# Patient Record
Sex: Female | Born: 1985 | Race: Asian | Hispanic: No | Marital: Married | Smoking: Current every day smoker
Health system: Southern US, Community
[De-identification: ages and names within clinical notes are randomized; demographics above are authoritative.]

## PROBLEM LIST (undated history)

## (undated) ENCOUNTER — Inpatient Hospital Stay (HOSPITAL_COMMUNITY): Payer: Self-pay

## (undated) DIAGNOSIS — D649 Anemia, unspecified: Secondary | ICD-10-CM

## (undated) DIAGNOSIS — O24419 Gestational diabetes mellitus in pregnancy, unspecified control: Secondary | ICD-10-CM

## (undated) HISTORY — DX: Gestational diabetes mellitus in pregnancy, unspecified control: O24.419

---

## 2010-11-05 ENCOUNTER — Emergency Department (HOSPITAL_COMMUNITY)
Admission: EM | Admit: 2010-11-05 | Discharge: 2010-11-05 | Disposition: A | Discharge status: Home / Self Care | Payer: Managed Care, Other (non HMO) | Attending: Emergency Medicine | Admitting: Emergency Medicine

## 2010-11-05 DIAGNOSIS — R109 Unspecified abdominal pain: Secondary | ICD-10-CM | POA: Insufficient documentation

## 2010-11-05 DIAGNOSIS — N390 Urinary tract infection, site not specified: Secondary | ICD-10-CM | POA: Insufficient documentation

## 2010-11-05 DIAGNOSIS — R319 Hematuria, unspecified: Secondary | ICD-10-CM | POA: Insufficient documentation

## 2010-11-05 LAB — WET PREP, GENITAL
Trich, Wet Prep: NONE SEEN
Yeast Wet Prep HPF POC: NONE SEEN

## 2010-11-05 LAB — URINE MICROSCOPIC-ADD ON

## 2010-11-05 LAB — URINALYSIS, ROUTINE W REFLEX MICROSCOPIC
Glucose, UA: NEGATIVE mg/dL
Specific Gravity, Urine: 1.022 (ref 1.005–1.030)

## 2010-11-06 LAB — GC/CHLAMYDIA PROBE AMP, GENITAL
Chlamydia, DNA Probe: NEGATIVE
GC Probe Amp, Genital: NEGATIVE

## 2010-11-07 LAB — URINE CULTURE
Colony Count: 80000
Culture  Setup Time: 201209040315

## 2011-02-20 ENCOUNTER — Inpatient Hospital Stay (HOSPITAL_COMMUNITY)
Admission: AD | Admit: 2011-02-20 | Discharge: 2011-02-20 | Disposition: A | Discharge status: Home / Self Care | Payer: Managed Care, Other (non HMO) | Source: Ambulatory Visit | Attending: Obstetrics and Gynecology | Admitting: Obstetrics and Gynecology

## 2011-02-20 ENCOUNTER — Encounter (HOSPITAL_COMMUNITY): Payer: Self-pay | Admitting: *Deleted

## 2011-02-20 DIAGNOSIS — B372 Candidiasis of skin and nail: Secondary | ICD-10-CM

## 2011-02-20 DIAGNOSIS — H9209 Otalgia, unspecified ear: Secondary | ICD-10-CM

## 2011-02-20 DIAGNOSIS — H9201 Otalgia, right ear: Secondary | ICD-10-CM

## 2011-02-20 MED ORDER — NYSTATIN-TRIAMCINOLONE 100000-0.1 UNIT/GM-% EX OINT: TOPICAL_OINTMENT | Freq: Two times a day (BID) | CUTANEOUS | Status: DC

## 2011-02-20 NOTE — ED Provider Notes (Signed)
History     Chief Complaint  Patient presents with  . Otalgia   HPI  Deanna Kaufman 25 y.o. presents with complaints of a right sore ear and a "cold" rash on her breasts. She has recently had a cold.  She describes the rash as beginning small and now larger.  There is itching associated with the rash.  She also states she had a ring put in to prevent pregnancy, in [place] 3-4 years ago and wanted to know if it could be removed and replaced tonight.     No past medical history on file.  No past surgical history on file.  No family history on file.  History  Substance Use Topics  . Smoking status: Never Smoker   . Smokeless tobacco: Never Used  . Alcohol Use: No    Allergies: Allergies not on file  No prescriptions prior to admission    Review of Systems  Constitutional: Negative for fever and chills.  HENT: Positive for ear pain (right ear).   Gastrointestinal: Negative for nausea, vomiting, abdominal pain and diarrhea.  Genitourinary: Negative.   Skin:       Rash on both breasts.   Physical Exam   Blood pressure 99/69, pulse 76, temperature 98.7 F (37.1 C), temperature source Oral, resp. rate 20, height 5\' 1"  (1.549 m), weight 122 lb (55.339 kg), last menstrual period 02/14/2011, SpO2 99.00%.  Physical Exam  Constitutional: She is oriented to person, place, and time. She appears well-developed and well-nourished. No distress.  HENT:  Right Ear: External ear normal.  Left Ear: External ear normal.  Neck: Normal range of motion.  Respiratory: Effort normal.  Neurological: She is alert and oriented to person, place, and time.  Skin: Skin is warm and dry.       There is a small brownish patch/area on the right breast b/t 12:00 and 1:00, slightly dry without oozing or redness.  The left breast has a similar area b/t 1:00-2:00 and dry.      MAU Course  Procedures  MDM   Assessment and Plan  A:  Right ear pain       Possible candidal infection of the  skin  P:  Rx for Mycolog to use bid to affected area X 1 week       May use over the counter cold products for URI sxs       Health Dept # given for her to call for contraception.  Deanna Kaufman,EVE M 02/20/2011, 8:43 PM   Matt Holmes, NP 02/20/11 2054

## 2011-02-20 NOTE — Progress Notes (Signed)
Pt states she has a rt ear ache and a spot on each breast she wants to have examined

## 2011-02-21 NOTE — ED Provider Notes (Signed)
Attestation of Attending Supervision of Advanced Practitioner: Evaluation and management procedures were performed by the PA/NP/CNM/OB Fellow under my supervision/collaboration. Chart reviewed and agree with management and plan.  Ramiel Forti V 02/21/2011 7:51 AM

## 2012-01-27 ENCOUNTER — Emergency Department (HOSPITAL_COMMUNITY)
Admission: EM | Admit: 2012-01-27 | Discharge: 2012-01-27 | Disposition: A | Discharge status: Home / Self Care | Payer: Managed Care, Other (non HMO) | Attending: Emergency Medicine | Admitting: Emergency Medicine

## 2012-01-27 ENCOUNTER — Encounter (HOSPITAL_COMMUNITY): Payer: Self-pay | Admitting: *Deleted

## 2012-01-27 DIAGNOSIS — H9190 Unspecified hearing loss, unspecified ear: Secondary | ICD-10-CM | POA: Insufficient documentation

## 2012-01-27 DIAGNOSIS — H538 Other visual disturbances: Secondary | ICD-10-CM | POA: Insufficient documentation

## 2012-01-27 DIAGNOSIS — H6690 Otitis media, unspecified, unspecified ear: Secondary | ICD-10-CM | POA: Insufficient documentation

## 2012-01-27 DIAGNOSIS — R51 Headache: Secondary | ICD-10-CM | POA: Insufficient documentation

## 2012-01-27 MED ORDER — AMOXICILLIN 500 MG PO CAPS: 1000.0000 mg | ORAL_CAPSULE | Freq: Two times a day (BID) | ORAL | Status: DC

## 2012-01-27 MED ORDER — TRAMADOL HCL 50 MG PO TABS: 50.0000 mg | ORAL_TABLET | Freq: Four times a day (QID) | ORAL | Status: DC | PRN

## 2012-01-27 NOTE — ED Notes (Signed)
Pt c/o right ear pain and right head pain x 2 days.  Denies n/v, fevers/chills.

## 2012-01-27 NOTE — ED Provider Notes (Signed)
History   This chart was scribed for [employer]. Rubin Payor, MD by Sofie Rower, ED Scribe. The patient was seen in room TR06C/TR06C and the patient's care was started at 8:56PM.    CSN: [id number]  Arrival date & time 01/27/12  2016   First MD Initiated Contact with Patient 01/27/12 2056      Chief Complaint  Patient presents with  . Otalgia    (Consider location/radiation/quality/duration/timing/severity/associated sxs/prior treatment) The history is provided by the patient. No language interpreter was used.    Deanna Kaufman is a 26 y.o. female , who presents to the Emergency Department complaining of  sudden, progressively worsening, otalgia, located at the right ear, onset two days ago (01/25/12).  Associated symptoms include headache located at the right side of the head, hearing loss, and blurred vision. The pt reports she has been experiencing a constant earache, accompanied by hearing loss, for the past two days. The pt informs both of her ears are painful, however, her right ear is in significantly more pain than the left.   The pt denies allergies to any medications. In addition, the pt denies any possibility of pregnancy.   The pt does not smoke or drink alcohol.      History reviewed. No pertinent past medical history.  History reviewed. No pertinent past surgical history.  History reviewed. No pertinent family history.  History  Substance Use Topics  . Smoking status: Never Smoker   . Smokeless tobacco: Never Used  . Alcohol Use: No    OB History    Grav Para Term Preterm Abortions TAB SAB Ect Mult Living   1 1        1       Review of Systems  Constitutional: Negative for fever and chills.  HENT: Positive for ear pain. Negative for rhinorrhea.   Eyes: Positive for visual disturbance. Negative for pain.  Respiratory: Negative for cough.   Gastrointestinal: Negative for nausea and vomiting.  Neurological: Positive for headaches.    Allergies  Review  of patient's allergies indicates no known allergies.  Home Medications   Current Outpatient Rx  Name  Route  Sig  Dispense  Refill  . IBUPROFEN 200 MG PO TABS   Oral   Take 400 mg by mouth every 8 (eight) hours as needed. For pain or fever         . AMOXICILLIN 500 MG PO CAPS   Oral   Take 2 capsules (1,000 mg total) by mouth 2 (two) times daily.   28 capsule   0   . TRAMADOL HCL 50 MG PO TABS   Oral   Take 1 tablet (50 mg total) by mouth every 6 (six) hours as needed for pain.   15 tablet   0     BP 110/75  Pulse 69  Temp 98.4 F (36.9 C) (Oral)  Resp 20  SpO2 100%  Physical Exam  Nursing note and vitals reviewed. Constitutional: She is oriented to person, place, and time. She appears well-developed and well-nourished.  HENT:  Head: Atraumatic.  Right Ear: There is mastoid tenderness. Tympanic membrane is erythematous and bulging.  Left Ear: Tympanic membrane normal. Tympanic membrane is not erythematous and not bulging.  Nose: Nose normal.  Mouth/Throat: Oropharynx is clear and moist.  Eyes: Conjunctivae normal and EOM are normal. Pupils are equal, round, and reactive to light.  Neck: Normal range of motion. Neck supple.  Cardiovascular: Normal rate, regular rhythm and normal heart sounds.   Pulmonary/Chest:  Effort normal and breath sounds normal. No respiratory distress.  Lymphadenopathy:    She has no cervical adenopathy.  Neurological: She is alert and oriented to person, place, and time.  Skin: Skin is warm and dry.  Psychiatric: She has a normal mood and affect. Her behavior is normal.    ED Course  Procedures (including critical care time)  9:04 PM- Treatment plan concerning management of ear infection and application of antibiotics and pain management discussed with patient. Pt agrees with treatment.     Labs Reviewed - No data to display No results found.   1. Otitis media       MDM  Patient with ear pain. Apparent otitis media. Some  tenderness over right mastiod, but no swelling or erethema. Was given instruction about what to watch for. Normal neuro exam for me. Well appearing.       I personally performed the services described in this documentation, which was scribed in my presence. The recorded information has been reviewed and is accurate.     Juliet Rude. Rubin Payor, MD 01/27/12 2130

## 2012-10-29 ENCOUNTER — Encounter (HOSPITAL_COMMUNITY): Payer: Self-pay

## 2012-10-29 ENCOUNTER — Emergency Department (HOSPITAL_COMMUNITY)
Admission: EM | Admit: 2012-10-29 | Discharge: 2012-10-29 | Disposition: A | Discharge status: Home / Self Care | Payer: Managed Care, Other (non HMO) | Attending: Emergency Medicine | Admitting: Emergency Medicine

## 2012-10-29 DIAGNOSIS — L02211 Cutaneous abscess of abdominal wall: Secondary | ICD-10-CM

## 2012-10-29 DIAGNOSIS — Z79899 Other long term (current) drug therapy: Secondary | ICD-10-CM | POA: Insufficient documentation

## 2012-10-29 DIAGNOSIS — L02219 Cutaneous abscess of trunk, unspecified: Secondary | ICD-10-CM | POA: Insufficient documentation

## 2012-10-29 DIAGNOSIS — L089 Local infection of the skin and subcutaneous tissue, unspecified: Secondary | ICD-10-CM | POA: Insufficient documentation

## 2012-10-29 MED ORDER — IBUPROFEN 800 MG PO TABS
800.0000 mg | ORAL_TABLET | Freq: Once | ORAL | Status: AC
  Administered 2012-10-29: 800 mg via ORAL
  Filled 2012-10-29: qty 1

## 2012-10-29 MED ORDER — SULFAMETHOXAZOLE-TRIMETHOPRIM 800-160 MG PO TABS: 1.0000 | ORAL_TABLET | Freq: Two times a day (BID) | ORAL | Status: DC

## 2012-10-29 NOTE — ED Notes (Signed)
Pt complains of an abcess on her left lower abdomen and one on her vagina for three days

## 2012-10-30 NOTE — ED Provider Notes (Signed)
CSN: [id number]     Arrival date & time 10/29/12  1922 History   First MD Initiated Contact with Patient 10/29/12 2309     Chief Complaint  Patient presents with  . Bartholin's Cyst  . Recurrent Skin Infections   (Consider location/radiation/quality/duration/timing/severity/associated sxs/prior Treatment) The history is provided by the patient.   patient reports abscess to her left lower anterior abdominal wall as well as the left side of her vagina.  She reports no significant drainage.  She does shave her pubic hair with a razor.  No history of prior abscesses.  No fevers or chills.  No surrounding erythema.  No vaginal complaints.  Symptoms are mild to moderate in severity.  Pain is worsened by palpation.  History reviewed. No pertinent past medical history. History reviewed. No pertinent past surgical history. History reviewed. No pertinent family history. History  Substance Use Topics  . Smoking status: Never Smoker   . Smokeless tobacco: Never Used  . Alcohol Use: No   OB History   Grav Para Term Preterm Abortions TAB SAB Ect Mult Living   1 1        1      Review of Systems  All other systems reviewed and are negative.    Allergies  Review of patient's allergies indicates no known allergies.  Home Medications   Current Outpatient Rx  Name  Route  Sig  Dispense  Refill  . Multiple Vitamin (MULTIVITAMIN WITH MINERALS) TABS tablet   Oral   Take 1 tablet by mouth daily.         . vitamin C (ASCORBIC ACID) 500 MG tablet   Oral   Take 500 mg by mouth daily.         Marland Kitchen sulfamethoxazole-trimethoprim (SEPTRA DS) 800-160 MG per tablet   Oral   Take 1 tablet by mouth every 12 (twelve) hours.   10 tablet   0    BP 107/64  Pulse 67  Temp(Src) 98.2 F (36.8 C) (Oral)  Resp 20  SpO2 100%  LMP 10/08/2012 Physical Exam  Nursing note and vitals reviewed. Constitutional: She is oriented to person, place, and time. She appears well-developed and well-nourished. No  distress.  HENT:  Head: Normocephalic and atraumatic.  Eyes: EOM are normal.  Neck: Normal range of motion.  Cardiovascular: Normal rate, regular rhythm and normal heart sounds.   Pulmonary/Chest: Effort normal and breath sounds normal.  Abdominal: Soft. She exhibits no distension.  Small focal abscess of her very low left lower quadrant of her anterior abdomen.  No surrounding erythema.  Genitourinary:  Chaperone present during GU examination.  GU exam demonstrates a very small area of folliculitis of her left lower labia majora.  There is no fluctuance or surrounding erythema.  The areas is too punctate to incise and drain.  Musculoskeletal: Normal range of motion.  Neurological: She is alert and oriented to person, place, and time.  Skin: Skin is warm and dry.  Psychiatric: She has a normal mood and affect. Judgment normal.    ED Course  Procedures (including critical care time)  INCISION AND DRAINAGE Performed by: Lyanne Co Consent: Verbal consent obtained. Risks and benefits: risks, benefits and alternatives were discussed Time out performed prior to procedure Type: abscess Body area: left lower abdominal wall Anesthesia: local infiltration Incision was made with a scalpel. Local anesthetic: lidocaine 2 % with epinephrine Anesthetic total: 3 ml Complexity: complex Blunt dissection to break up loculations Drainage: purulent Drainage amount: Small  Packing material:  None  Patient tolerance: Patient tolerated the procedure well with no immediate complications.     Labs Review Labs Reviewed - No data to display Imaging Review No results found.  MDM   1. Abdominal wall abscess    Incision and drainage of the abdominal wall abscess.  Will be placed on antibiotics.  The area of her left labia is too small to incise at this time.  Recommend warm compresses and she will be on antibiotics.  Discharge home in good condition.  She understands to return to the ER for  new or worsening symptoms    Lyanne Co, MD 10/30/12 [phone]

## 2012-12-20 ENCOUNTER — Encounter (HOSPITAL_COMMUNITY): Payer: Self-pay | Admitting: *Deleted

## 2012-12-20 ENCOUNTER — Inpatient Hospital Stay (HOSPITAL_COMMUNITY)
Admission: AD | Admit: 2012-12-20 | Discharge: 2012-12-20 | Disposition: A | Discharge status: Home / Self Care | Payer: Managed Care, Other (non HMO) | Source: Ambulatory Visit | Attending: Family Medicine | Admitting: Family Medicine

## 2012-12-20 ENCOUNTER — Inpatient Hospital Stay (HOSPITAL_COMMUNITY): Payer: Managed Care, Other (non HMO)

## 2012-12-20 DIAGNOSIS — O209 Hemorrhage in early pregnancy, unspecified: Secondary | ICD-10-CM

## 2012-12-20 DIAGNOSIS — Z1389 Encounter for screening for other disorder: Secondary | ICD-10-CM

## 2012-12-20 DIAGNOSIS — Z3490 Encounter for supervision of normal pregnancy, unspecified, unspecified trimester: Secondary | ICD-10-CM

## 2012-12-20 LAB — URINALYSIS, ROUTINE W REFLEX MICROSCOPIC
Bilirubin Urine: NEGATIVE
Ketones, ur: NEGATIVE mg/dL
Nitrite: NEGATIVE
Protein, ur: NEGATIVE mg/dL
Urobilinogen, UA: 0.2 mg/dL (ref 0.0–1.0)
pH: 8 (ref 5.0–8.0)

## 2012-12-20 LAB — CBC
MCH: 21.8 pg — ABNORMAL LOW (ref 26.0–34.0)
Platelets: 248 10*3/uL (ref 150–400)
RBC: 5.13 MIL/uL — ABNORMAL HIGH (ref 3.87–5.11)

## 2012-12-20 LAB — ABO/RH: ABO/RH(D): O POS

## 2012-12-20 LAB — HCG, QUANTITATIVE, PREGNANCY: hCG, Beta Chain, Quant, S: 64150 m[IU]/mL — ABNORMAL HIGH (ref <5)

## 2012-12-20 LAB — WET PREP, GENITAL: Clue Cells Wet Prep HPF POC: NONE SEEN

## 2012-12-20 NOTE — MAU Provider Note (Signed)
Chart reviewed and agree with management and plan.  

## 2012-12-20 NOTE — MAU Note (Signed)
Patient presents with vaginal bleeding when wipes. States her last menstrual period was around September 11 sometime. Took a HPT but there was no control line.

## 2012-12-20 NOTE — MAU Provider Note (Signed)
History     CSN: [id number]  Arrival date and time: 12/20/12 0229   None     Chief Complaint  Patient presents with  . Vaginal Bleeding   HPI This is a 27 y.o. female at [redacted]w[redacted]d who presents with c/o bleeding.  States she thinks she is pregnant. Per RN report, pt does not want to be pregnant because her husband has no job "and does not help me at all".  RN overheard them arguing several times.  Patient states she may want an abortion.  Husband states "she is crazy" several times.  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 She denies any verbal, emotional, or physical abuse (RN had asked husband to leave room and questioned her)   RN Note: Patient presents with vaginal bleeding when wipes. States her last menstrual period was around September 11 sometime. Took a HPT but there was no control line.        OB History   Grav Para Term Preterm Abortions TAB SAB Ect Mult Living   2 1 1       1       History reviewed. No pertinent past medical history.  History reviewed. No pertinent past surgical history.  History reviewed. No pertinent family history.  History  Substance Use Topics  . Smoking status: Never Smoker   . Smokeless tobacco: Never Used  . Alcohol Use: No    Allergies: No Known Allergies  Prescriptions prior to admission  Medication Sig Dispense Refill  . Multiple Vitamin (MULTIVITAMIN WITH MINERALS) TABS tablet Take 1 tablet by mouth daily.      Marland Kitchen sulfamethoxazole-trimethoprim (SEPTRA DS) 800-160 MG per tablet Take 1 tablet by mouth every 12 (twelve) hours.  10 tablet  0  . vitamin C (ASCORBIC ACID) 500 MG tablet Take 500 mg by mouth daily.        Review of Systems  Constitutional: Negative for fever and malaise/fatigue.  Gastrointestinal: Negative for nausea, vomiting, abdominal pain, diarrhea and constipation.  Genitourinary:       Spotting    Physical Exam   Blood pressure 116/53, pulse 76, temperature 98 F (36.7 C), temperature  source Oral, resp. rate 18, height 4\' 11"  (1.499 m), weight 59.875 kg (132 lb), last menstrual period 11/12/2012.  Physical Exam  Constitutional: She is oriented to person, place, and time. She appears well-developed and well-nourished. No distress.  Cardiovascular: Normal rate.   Respiratory: Effort normal.  GI: Soft. She exhibits no distension. There is tenderness (over uterus with bimanual exam). There is no rebound and no guarding.  Genitourinary: Vagina normal and uterus normal. No vaginal discharge (no blood seen) found.  Uterus small, tender Adnexae nontender Cervix long and closed   Musculoskeletal: Normal range of motion.  Neurological: She is alert and oriented to person, place, and time.  Skin: Skin is warm and dry.  Psychiatric: She has a normal mood and affect.    MAU Course  Procedures  MDM Results for orders placed during the hospital encounter of 12/20/12 (from the past 24 hour(s))  URINALYSIS, ROUTINE W REFLEX MICROSCOPIC     Status: None   Collection Time    12/20/12  2:43 AM      Result Value Range   Color, Urine YELLOW  YELLOW   APPearance CLEAR  CLEAR   Specific Gravity, Urine 1.020  1.005 - 1.030   pH 8.0  5.0 - 8.0   Glucose, UA NEGATIVE  NEGATIVE mg/dL   Hgb urine  dipstick NEGATIVE  NEGATIVE   Bilirubin Urine NEGATIVE  NEGATIVE   Ketones, ur NEGATIVE  NEGATIVE mg/dL   Protein, ur NEGATIVE  NEGATIVE mg/dL   Urobilinogen, UA 0.2  0.0 - 1.0 mg/dL   Nitrite NEGATIVE  NEGATIVE   Leukocytes, UA NEGATIVE  NEGATIVE  POCT PREGNANCY, URINE     Status: Abnormal   Collection Time    12/20/12  2:50 AM      Result Value Range   Preg Test, Ur POSITIVE (*) NEGATIVE  HCG, QUANTITATIVE, PREGNANCY     Status: Abnormal   Collection Time    12/20/12  3:27 AM      Result Value Range   hCG, Beta Chain, Quant, S 64150 (*) <5 mIU/mL  CBC     Status: Abnormal   Collection Time    12/20/12  3:27 AM      Result Value Range   WBC 8.8  4.0 - 10.5 K/uL   RBC 5.13 (*)  3.87 - 5.11 MIL/uL   Hemoglobin 11.2 (*) 12.0 - 15.0 g/dL   HCT 95.6 (*) 21.3 - 08.6 %   MCV 67.8 (*) 78.0 - 100.0 fL   MCH 21.8 (*) 26.0 - 34.0 pg   MCHC 32.2  30.0 - 36.0 g/dL   RDW 57.8  46.9 - 62.9 %   Platelets 248  150 - 400 K/uL  ABO/RH     Status: None   Collection Time    12/20/12  3:27 AM      Result Value Range   ABO/RH(D) O POS    WET PREP, GENITAL     Status: Abnormal   Collection Time    12/20/12  3:29 AM      Result Value Range   Yeast Wet Prep HPF POC NONE SEEN  NONE SEEN   Trich, Wet Prep NONE SEEN  NONE SEEN   Clue Cells Wet Prep HPF POC NONE SEEN  NONE SEEN   WBC, Wet Prep HPF POC FEW (*) NONE SEEN   US showed 7.3wks CRL with [facility] 08/05/13 Small subchorionic hemorrhage   Assessment and Plan  A:  SIUP at 7.3weeks      [facility]  P:  DIscharge home      Pelvic rest      List of providers given.        [facility] 12/20/2012, 3:07 AM

## 2012-12-21 LAB — GC/CHLAMYDIA PROBE AMP: GC Probe RNA: NEGATIVE

## 2013-01-11 LAB — OB RESULTS CONSOLE HEPATITIS B SURFACE ANTIGEN: Hepatitis B Surface Ag: NEGATIVE

## 2013-01-11 LAB — OB RESULTS CONSOLE HIV ANTIBODY (ROUTINE TESTING): HIV: NONREACTIVE

## 2013-01-11 LAB — OB RESULTS CONSOLE RUBELLA ANTIBODY, IGM: RUBELLA: UNDETERMINED

## 2013-01-20 LAB — OB RESULTS CONSOLE GC/CHLAMYDIA
Chlamydia: NEGATIVE
Gonorrhea: NEGATIVE

## 2013-01-24 ENCOUNTER — Inpatient Hospital Stay (HOSPITAL_COMMUNITY)
Admission: AD | Admit: 2013-01-24 | Discharge: 2013-01-25 | Disposition: A | Discharge status: Home / Self Care | Payer: Managed Care, Other (non HMO) | Source: Ambulatory Visit | Attending: Obstetrics & Gynecology | Admitting: Obstetrics & Gynecology

## 2013-01-24 DIAGNOSIS — R109 Unspecified abdominal pain: Secondary | ICD-10-CM | POA: Insufficient documentation

## 2013-01-24 DIAGNOSIS — O209 Hemorrhage in early pregnancy, unspecified: Secondary | ICD-10-CM | POA: Insufficient documentation

## 2013-01-24 HISTORY — DX: Anemia, unspecified: D64.9

## 2013-01-24 NOTE — MAU Note (Signed)
Pt reports vaginal bleeding, lower abd pain . Both symptoms x 20 minutes.

## 2013-01-25 ENCOUNTER — Inpatient Hospital Stay (HOSPITAL_COMMUNITY): Payer: Managed Care, Other (non HMO)

## 2013-01-25 ENCOUNTER — Encounter (HOSPITAL_COMMUNITY): Payer: Self-pay | Admitting: *Deleted

## 2013-01-25 LAB — URINE MICROSCOPIC-ADD ON

## 2013-01-25 LAB — CBC
HCT: 31.1 % — ABNORMAL LOW (ref 36.0–46.0)
Hemoglobin: 10.2 g/dL — ABNORMAL LOW (ref 12.0–15.0)
MCHC: 32.8 g/dL (ref 30.0–36.0)
WBC: 6.9 10*3/uL (ref 4.0–10.5)

## 2013-01-25 LAB — URINALYSIS, ROUTINE W REFLEX MICROSCOPIC
Bilirubin Urine: NEGATIVE
Glucose, UA: NEGATIVE mg/dL
Protein, ur: NEGATIVE mg/dL
Urobilinogen, UA: 0.2 mg/dL (ref 0.0–1.0)

## 2013-01-25 LAB — TYPE AND SCREEN
ABO/RH(D): O POS
Antibody Screen: NEGATIVE

## 2013-01-25 NOTE — MAU Note (Signed)
Pt reports light red bleeding starting at 2300 tonight and intermittant low abd cramping that began at the same time.  She says that she did do quite a bit of cleaning at her house yesterday to prepare for visitors.  Thinks she may have done too much.

## 2013-01-27 ENCOUNTER — Ambulatory Visit (INDEPENDENT_AMBULATORY_CARE_PROVIDER_SITE_OTHER): Payer: Managed Care, Other (non HMO) | Admitting: Family Medicine

## 2013-01-27 DIAGNOSIS — H60392 Other infective otitis externa, left ear: Secondary | ICD-10-CM

## 2013-01-27 DIAGNOSIS — H60399 Other infective otitis externa, unspecified ear: Secondary | ICD-10-CM

## 2013-01-27 MED ORDER — CEPHALEXIN 500 MG PO CAPS: 500.0000 mg | ORAL_CAPSULE | Freq: Three times a day (TID) | ORAL | Status: DC

## 2013-01-27 MED ORDER — HYDROCODONE-ACETAMINOPHEN 5-325 MG PO TABS: 1.0000 | ORAL_TABLET | Freq: Four times a day (QID) | ORAL | Status: DC | PRN

## 2013-01-27 NOTE — Progress Notes (Signed)
Subjective:    Patient ID: Deanna Kaufman, female    DOB: 04/09/1985, 27 y.o.   MRN: [id number]  HPI This chart was scribed for Kenyon Ana Lauenstein-MD, by Ladona Ridgel Rajvir Ernster, Scribe. This patient was seen in room 8 and the patient's care was started at 5:30 PM.  HPI Comments: Deanna Kaufman is a 27 y.o. female who is lives at home w/her family and is also currently pregnant.  Today she presents to the [facility] complaining of constant, gradually worsening left ear pain, onset 2 days ago. she denies any associated fever/chills, discharge or change in hearing.  Past Medical History  Diagnosis Date  . Anemia     No past surgical history on file.  No family history on file.  History   Social History  . Marital Status: Married    Spouse Name: N/A    Number of Children: N/A  . Years of Education: N/A   Occupational History  . Not on file.   Social History Main Topics  . Smoking status: Current Every Deanna Kaufman Smoker  . Smokeless tobacco: Never Used  . Alcohol Use: No  . Drug Use: No     Comment: smokes hookah three times daily  . Sexual Activity: Yes    Birth Control/ Protection: None     Comment: today;     Other Topics Concern  . Not on file   Social History Narrative  . No narrative on file    No Known Allergies  There are no active problems to display for this patient.   Results for orders placed during the hospital encounter of 01/24/13  CBC      Result Value Range   WBC 6.9  4.0 - 10.5 K/uL   RBC 4.61  3.87 - 5.11 MIL/uL   Hemoglobin 10.2 (*) 12.0 - 15.0 g/dL   HCT 95.2 (*) 84.1 - 32.4 %   MCV 67.5 (*) 78.0 - 100.0 fL   MCH 22.1 (*) 26.0 - 34.0 pg   MCHC 32.8  30.0 - 36.0 g/dL   RDW 40.1  02.7 - 25.3 %   Platelets 248  150 - 400 K/uL  URINALYSIS, ROUTINE W REFLEX MICROSCOPIC      Result Value Range   Color, Urine YELLOW  YELLOW   APPearance CLEAR  CLEAR   Specific Gravity, Urine <1.005 (*) 1.005 - 1.030   pH 6.5  5.0 - 8.0   Glucose,  UA NEGATIVE  NEGATIVE mg/dL   Hgb urine dipstick TRACE (*) NEGATIVE   Bilirubin Urine NEGATIVE  NEGATIVE   Ketones, ur NEGATIVE  NEGATIVE mg/dL   Protein, ur NEGATIVE  NEGATIVE mg/dL   Urobilinogen, UA 0.2  0.0 - 1.0 mg/dL   Nitrite NEGATIVE  NEGATIVE   Leukocytes, UA NEGATIVE  NEGATIVE  URINE MICROSCOPIC-ADD ON      Result Value Range   Squamous Epithelial / LPF RARE  RARE   WBC, UA 0-2  <3 WBC/hpf   RBC / HPF 0-2  <3 RBC/hpf   Bacteria, UA RARE  RARE  TYPE AND SCREEN      Result Value Range   ABO/RH(D) O POS     Antibody Screen NEG     Sample Expiration 01/28/2013      1. Otitis, externa, infective, left     Meds ordered this encounter  Medications  . cephALEXin (KEFLEX) 500 MG capsule    Sig: Take 1 capsule (500 mg total) by mouth 3 (three) times daily.  Dispense:  21 capsule    Refill:  0  . HYDROcodone-acetaminophen (NORCO) 5-325 MG per tablet    Sig: Take 1 tablet by mouth every 6 (six) hours as needed for moderate pain.    Dispense:  30 tablet    Refill:  0    LMP 11/12/2012   Review of Systems  Constitutional: Negative for chills and fatigue.  HENT: Positive for ear pain (left ear). Negative for ear discharge.   Respiratory: Negative for shortness of breath.   Cardiovascular: Negative for chest pain.       Objective:   Physical Exam Physical Exam  Nursing note and vitals reviewed. Constitutional: Patient is oriented to person, place, and time. Patient appears well-developed and well-nourished. No distress.  HENT: 3 mm abscess in the outer external canal of her left ear. Head: Normocephalic and atraumatic.  Neck: Neck supple. No tracheal deviation present.  Cardiovascular: Normal rate, regular rhythm and normal heart sounds.   No murmur heard. Pulmonary/Chest: Effort normal and breath sounds normal. No respiratory distress. Patient has no wheezes. Patient has no rales.  Musculoskeletal: Normal range of motion.  Neurological: Patient is alert and  oriented to person, place, and time.  Skin: Skin is warm and dry.  Psychiatric: Patient has a normal mood and affect. Patient's behavior is normal.    Left ear canal:  4 mm pustule lower canal, TM okay       Assessment & Plan:  Otitis, externa, infective, left - Plan: cephALEXin (KEFLEX) 500 MG capsule, HYDROcodone-acetaminophen (NORCO) 5-325 MG per tablet  Signed, Elvina Sidle, MD

## 2013-03-04 NOTE — L&D Delivery Note (Signed)
Delivery Note At 4:36 PM a healthy female was delivered via Vaginal, Spontaneous Delivery (Presentation: Left Occiput Anterior).  APGAR: 8, 9; weight .   Placenta status: Intact, Spontaneous.  Cord: 3 vessels with the following complications: None.  Cord pH: Not done  Anesthesia: None  Episiotomy: None Lacerations: 2nd degree;Perineal Suture Repair: 3.0 vicryl , 4.0 vicryl Est. Blood Loss (mL): 300  Mom to postpartum.  Baby to Nursery. Informed consent for circumcision obtained.  Marie-Lyne Bradley Bostelman 07/20/2013, 5:01 PM

## 2013-06-11 LAB — OB RESULTS CONSOLE RPR: RPR: NONREACTIVE

## 2013-07-06 LAB — OB RESULTS CONSOLE GBS: GBS: NEGATIVE

## 2013-07-07 ENCOUNTER — Encounter: Payer: Managed Care, Other (non HMO) | Attending: Obstetrics & Gynecology

## 2013-07-07 DIAGNOSIS — O99810 Abnormal glucose complicating pregnancy: Secondary | ICD-10-CM | POA: Insufficient documentation

## 2013-07-07 DIAGNOSIS — Z713 Dietary counseling and surveillance: Secondary | ICD-10-CM | POA: Insufficient documentation

## 2013-07-14 VITALS — Ht 60.0 in | Wt 135.2 lb

## 2013-07-14 DIAGNOSIS — O99810 Abnormal glucose complicating pregnancy: Secondary | ICD-10-CM

## 2013-07-15 NOTE — Progress Notes (Signed)
  Patient was seen on 07/15/13 for Gestational Diabetes self-management class at the [facility]. The following learning objectives were met by the patient during this course:   States the definition of Gestational Diabetes  States why dietary management is important in controlling blood glucose  Describes the effects of carbohydrates on blood glucose levels  Demonstrates ability to create a balanced meal plan  Demonstrates carbohydrate counting   States when to check blood glucose levels  Demonstrates proper blood glucose monitoring techniques  States the effect of stress and exercise on blood glucose levels  States the importance of limiting caffeine and abstaining from alcohol and smoking  Plan:  Aim for 2 Carb Choices per meal (30 grams) +/- 1 either way for breakfast Aim for 3 Carb Choices per meal (45 grams) +/- 1 either way from lunch and dinner Aim for 1-2 Carbs per snack Begin reading food labels for Total Carbohydrate and sugar grams of foods Consider  increasing your activity level by walking daily as tolerated Begin checking BG before breakfast and 1-2 hours after first bit of breakfast, lunch and dinner after  as directed by MD  Take medication  as directed by MD  Blood glucose monitor given: None.....already testing  Patient instructed to monitor glucose levels: FBS: 60 - <90 2 hour: <120  Patient received the following handouts:  Nutrition Diabetes and Pregnancy  Carbohydrate Counting List  Meal Planning worksheet  Patient will be seen for follow-up as needed.

## 2013-07-20 ENCOUNTER — Inpatient Hospital Stay (HOSPITAL_COMMUNITY)
Admission: AD | Admit: 2013-07-20 | Discharge: 2013-07-22 | DRG: 775 | Disposition: A | Discharge status: Home / Self Care | Payer: Managed Care, Other (non HMO) | Source: Ambulatory Visit | Attending: Obstetrics & Gynecology | Admitting: Obstetrics & Gynecology

## 2013-07-20 ENCOUNTER — Encounter (HOSPITAL_COMMUNITY): Payer: Self-pay | Admitting: *Deleted

## 2013-07-20 DIAGNOSIS — O99334 Smoking (tobacco) complicating childbirth: Secondary | ICD-10-CM | POA: Diagnosis present

## 2013-07-20 DIAGNOSIS — O99814 Abnormal glucose complicating childbirth: Secondary | ICD-10-CM | POA: Diagnosis present

## 2013-07-20 DIAGNOSIS — IMO0001 Reserved for inherently not codable concepts without codable children: Secondary | ICD-10-CM

## 2013-07-20 DIAGNOSIS — O9903 Anemia complicating the puerperium: Secondary | ICD-10-CM | POA: Diagnosis present

## 2013-07-20 DIAGNOSIS — D649 Anemia, unspecified: Secondary | ICD-10-CM | POA: Diagnosis present

## 2013-07-20 LAB — TYPE AND SCREEN
ABO/RH(D): O POS
Antibody Screen: NEGATIVE

## 2013-07-20 LAB — RPR

## 2013-07-20 LAB — CBC
HEMATOCRIT: 34.5 % — AB (ref 36.0–46.0)
HEMOGLOBIN: 11.3 g/dL — AB (ref 12.0–15.0)
MCH: 22.7 pg — ABNORMAL LOW (ref 26.0–34.0)
MCHC: 32.8 g/dL (ref 30.0–36.0)
MCV: 69.4 fL — AB (ref 78.0–100.0)
Platelets: 242 10*3/uL (ref 150–400)
RBC: 4.97 MIL/uL (ref 3.87–5.11)
RDW: 14.4 % (ref 11.5–15.5)
WBC: 9.6 10*3/uL (ref 4.0–10.5)

## 2013-07-20 LAB — GLUCOSE, CAPILLARY: Glucose-Capillary: 118 mg/dL — ABNORMAL HIGH (ref 70–99)

## 2013-07-20 MED ORDER — OXYTOCIN BOLUS FROM INFUSION
500.0000 mL | INTRAVENOUS | Status: DC
  Administered 2013-07-20: 500 mL via INTRAVENOUS

## 2013-07-20 MED ORDER — LACTATED RINGERS IV SOLN: 500.0000 mL | INTRAVENOUS | Status: DC | PRN

## 2013-07-20 MED ORDER — WITCH HAZEL-GLYCERIN EX PADS: 1.0000 "application " | MEDICATED_PAD | CUTANEOUS | Status: DC | PRN

## 2013-07-20 MED ORDER — CITRIC ACID-SODIUM CITRATE 334-500 MG/5ML PO SOLN: 30.0000 mL | ORAL | Status: DC | PRN

## 2013-07-20 MED ORDER — LIDOCAINE HCL (PF) 1 % IJ SOLN
30.0000 mL | INTRAMUSCULAR | Status: DC | PRN
  Administered 2013-07-20: 30 mL via SUBCUTANEOUS
  Filled 2013-07-20: qty 30

## 2013-07-20 MED ORDER — SIMETHICONE 80 MG PO CHEW: 80.0000 mg | CHEWABLE_TABLET | ORAL | Status: DC | PRN

## 2013-07-20 MED ORDER — SENNOSIDES-DOCUSATE SODIUM 8.6-50 MG PO TABS
2.0000 | ORAL_TABLET | ORAL | Status: DC
  Administered 2013-07-21 – 2013-07-22 (×2): 2 via ORAL
  Filled 2013-07-20 (×2): qty 2

## 2013-07-20 MED ORDER — DIBUCAINE 1 % RE OINT: 1.0000 "application " | TOPICAL_OINTMENT | RECTAL | Status: DC | PRN

## 2013-07-20 MED ORDER — IBUPROFEN 600 MG PO TABS
600.0000 mg | ORAL_TABLET | Freq: Four times a day (QID) | ORAL | Status: DC
  Administered 2013-07-20 – 2013-07-22 (×8): 600 mg via ORAL
  Filled 2013-07-20 (×8): qty 1

## 2013-07-20 MED ORDER — BENZOCAINE-MENTHOL 20-0.5 % EX AERO
1.0000 "application " | INHALATION_SPRAY | CUTANEOUS | Status: DC | PRN
  Administered 2013-07-20: 1 via TOPICAL
  Filled 2013-07-20: qty 56

## 2013-07-20 MED ORDER — ZOLPIDEM TARTRATE 5 MG PO TABS: 5.0000 mg | ORAL_TABLET | Freq: Every evening | ORAL | Status: DC | PRN

## 2013-07-20 MED ORDER — IBUPROFEN 600 MG PO TABS: 600.0000 mg | ORAL_TABLET | Freq: Four times a day (QID) | ORAL | Status: DC | PRN

## 2013-07-20 MED ORDER — ONDANSETRON HCL 4 MG/2ML IJ SOLN: 4.0000 mg | Freq: Four times a day (QID) | INTRAMUSCULAR | Status: DC | PRN

## 2013-07-20 MED ORDER — OXYCODONE-ACETAMINOPHEN 5-325 MG PO TABS
1.0000 | ORAL_TABLET | ORAL | Status: DC | PRN
  Administered 2013-07-20 – 2013-07-21 (×2): 1 via ORAL
  Administered 2013-07-22: 2 via ORAL
  Administered 2013-07-22 (×2): 1 via ORAL
  Filled 2013-07-20: qty 2
  Filled 2013-07-20 (×4): qty 1

## 2013-07-20 MED ORDER — LANOLIN HYDROUS EX OINT: TOPICAL_OINTMENT | CUTANEOUS | Status: DC | PRN

## 2013-07-20 MED ORDER — BUTORPHANOL TARTRATE 1 MG/ML IJ SOLN
2.0000 mg | Freq: Once | INTRAMUSCULAR | Status: AC
  Administered 2013-07-20: 2 mg via INTRAVENOUS

## 2013-07-20 MED ORDER — ACETAMINOPHEN 325 MG PO TABS
650.0000 mg | ORAL_TABLET | ORAL | Status: DC | PRN
Start: 2013-07-20 — End: 2013-07-20

## 2013-07-20 MED ORDER — DIPHENHYDRAMINE HCL 25 MG PO CAPS: 25.0000 mg | ORAL_CAPSULE | Freq: Four times a day (QID) | ORAL | Status: DC | PRN

## 2013-07-20 MED ORDER — BUTORPHANOL TARTRATE 1 MG/ML IJ SOLN
INTRAMUSCULAR | Status: AC
  Filled 2013-07-20: qty 2

## 2013-07-20 MED ORDER — PRENATAL MULTIVITAMIN CH
1.0000 | ORAL_TABLET | Freq: Every day | ORAL | Status: DC
  Administered 2013-07-21 – 2013-07-22 (×2): 1 via ORAL
  Filled 2013-07-20 (×2): qty 1

## 2013-07-20 MED ORDER — ONDANSETRON HCL 4 MG PO TABS
4.0000 mg | ORAL_TABLET | ORAL | Status: DC | PRN
Start: 2013-07-20 — End: 2013-07-21

## 2013-07-20 MED ORDER — OXYCODONE-ACETAMINOPHEN 5-325 MG PO TABS: 1.0000 | ORAL_TABLET | ORAL | Status: DC | PRN

## 2013-07-20 MED ORDER — FLEET ENEMA 7-19 GM/118ML RE ENEM: 1.0000 | ENEMA | RECTAL | Status: DC | PRN

## 2013-07-20 MED ORDER — ONDANSETRON HCL 4 MG/2ML IJ SOLN: 4.0000 mg | INTRAMUSCULAR | Status: DC | PRN

## 2013-07-20 MED ORDER — OXYTOCIN 40 UNITS IN LACTATED RINGERS INFUSION - SIMPLE MED
1.0000 m[IU]/min | INTRAVENOUS | Status: DC
  Administered 2013-07-20: 2 m[IU]/min via INTRAVENOUS
  Filled 2013-07-20: qty 1000

## 2013-07-20 MED ORDER — TETANUS-DIPHTH-ACELL PERTUSSIS 5-2.5-18.5 LF-MCG/0.5 IM SUSP: 0.5000 mL | Freq: Once | INTRAMUSCULAR | Status: DC

## 2013-07-20 MED ORDER — TERBUTALINE SULFATE 1 MG/ML IJ SOLN: 0.2500 mg | Freq: Once | INTRAMUSCULAR | Status: DC | PRN

## 2013-07-20 MED ORDER — OXYTOCIN 40 UNITS IN LACTATED RINGERS INFUSION - SIMPLE MED: 62.5000 mL/h | INTRAVENOUS | Status: DC

## 2013-07-20 MED ORDER — LACTATED RINGERS IV SOLN
INTRAVENOUS | Status: DC
  Administered 2013-07-20 (×2): via INTRAVENOUS

## 2013-07-20 NOTE — MAU Note (Signed)
Contractions started yesterday night at 2200 and are now 5 min apart.  Denies LOF, bloody show.

## 2013-07-20 NOTE — H&P (Signed)
Deanna Kaufman is a 28 y.o. female presenting Maternal Medical History:  Reason for admission: Contractions.    28 yo, G2P1001, at 37.5 wks presenting with UCs since last night but bloody discharge and stronger UCs since 5 am. No leaking. Good FMs. No HA/vision changes. Has GDM-A1, BS last night was 125 after dinner.  [facility], started in 1st trimester, kept almost all visits but missed a few due to her school schedule and exams. She continued to use Hookah in pregnancy. GDM- A1 with well controlled blood sugars on diet (though started testing late due to missed appointments) Last sono - AGA,   OB History   Grav Para Term Preterm Abortions TAB SAB Ect Mult Living   2 1 1       1      Past Medical History  Diagnosis Date  . Anemia   . Gestational diabetes mellitus, antepartum    History reviewed. No pertinent past surgical history. Family History: family history is not on file. Social History:  reports that she has been smoking.  She has never used smokeless tobacco. She reports that she does not drink alcohol or use illicit drugs.   Prenatal Transfer Tool  Maternal Diabetes: Yes:  Diabetes Type:  Diet controlled Genetic Screening: Normal Maternal Ultrasounds/Referrals: Normal Fetal Ultrasounds or other Referrals:  None Maternal Substance Abuse:  Yes:  Type: Smoker  - Hookah Significant Maternal Medications:  none Significant Maternal Lab Results:  Lab values include: Group B Strep negative Other Comments:  None  ROS neg   Dilation: 6 Effacement (%): 80 Station: -1 Exam by:: Dr. Juliene PinaMody Blood pressure 111/61, pulse 86, temperature 97.6 F (36.4 C), temperature source Oral, resp. rate 18, height 4' 11.5" (1.511 m), weight 133 lb 6.4 oz (60.51 kg), last menstrual period 11/12/2012. Changed from 3 cm in triage at 7 am.   Exam Physical Exam  A&O x 3, no acute distress. Pleasant HEENT neg, no thyromegaly Lungs CTA bilat CV RRR, S2 normal Abdo soft, non tender,  non acute Extr no edema/ tenderness Pelvic  6 cm/ 80%/ Station: -1/ Vtx- AROM, clear fluid, pelvic adequate FHT 130s/ + accels/ no decels/ moderate variability- category I Toco q 3-4 minutes  Prenatal labs: ABO, Rh: --/--/O POS (11/24 0115) Antibody: NEG (11/24 0115) Rubella: Equivocal (11/10 0000) RPR: Nonreactive (04/10 0000)  HBsAg: Negative (11/10 0000)  HIV: Non-reactive (11/10 0000)  GBS: Negative (05/05 0000)   Assessment/Plan: 28 yo, G2P1001, at 37.5 wks in active labor. A1GDM, well controlled, GBS neg. 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 EFW 6.1/2 lbs, anticipate SVD. Pain meds declined.    Robley FriesVaishali R Daishia Fetterly 07/20/2013, 10:01 AM

## 2013-07-20 NOTE — Progress Notes (Signed)
Subjective: Doing well, pain increased, UCs q3-4 min  Anesthesia none   Objective: BP 107/69  Pulse 80  Temp(Src) 97.6 F (36.4 C) (Oral)  Resp 20  Ht 4' 11.5" (1.511 m)  Wt 60.51 kg (133 lb 6.4 oz)  BMI 26.50 kg/m2  LMP 11/12/2012   FHT:  FHR: 130 bpm, variability: moderate,  accelerations:  Present,  decelerations:  Present Mild variables UC:   regular, every 3-4 minutes VE:   Dilation: 7 Effacement (%): 90 Station: 0 Exam by:: Lennis Korb Left ant. occiput   Assessment / Plan: Augmentation of labor, progressing well  Fetal Wellbeing:  Category I Pain Control:  Labor support without medications  Anticipated MOD:  NSVD  Deanna Kaufman 07/20/2013, 3:17 PM

## 2013-07-21 LAB — CBC
HCT: 33.1 % — ABNORMAL LOW (ref 36.0–46.0)
HEMOGLOBIN: 10.7 g/dL — AB (ref 12.0–15.0)
MCH: 22.3 pg — ABNORMAL LOW (ref 26.0–34.0)
MCHC: 32.3 g/dL (ref 30.0–36.0)
MCV: 69.1 fL — ABNORMAL LOW (ref 78.0–100.0)
Platelets: 215 10*3/uL (ref 150–400)
RBC: 4.79 MIL/uL (ref 3.87–5.11)
RDW: 14.3 % (ref 11.5–15.5)
WBC: 12.4 10*3/uL — AB (ref 4.0–10.5)

## 2013-07-21 MED ORDER — MEASLES, MUMPS & RUBELLA VAC ~~LOC~~ INJ
0.5000 mL | INJECTION | Freq: Once | SUBCUTANEOUS | Status: AC
  Administered 2013-07-22: 0.5 mL via SUBCUTANEOUS
  Filled 2013-07-21 (×2): qty 0.5

## 2013-07-21 NOTE — Lactation Note (Signed)
This note was copied from the chart of Deanna Kaufman. Lactation Consultation Note    Initial consult with this mom and baby, now 5219 hours old, and 37 2/[redacted] weeks gestation, just over 6 pounds. The baby breast fed well initialy, but has been sleepy for mom since early this morning, and was circumcised this morning also. He was wrapped in many blankets, asleep in his crib. i explained skin to skin to mo, and assisted her with latching her baby. Mom has very large breasts with evert nipples. i showed her cross cradle hold, with support pillows, and the baby latched after a few attempts, and suckled with strong, rhythmic sucks and swallows. Mom was shown hand expression, and had easily expressed colostrum. The baby was still feeding when I left the room. Mom very receptive to teaching, and knows to call for questions/concnerns. Lactation services reviewed with mom.  Patient Name: Deanna Kaufman XBJYN'WToday's Date: 07/21/2013 Reason for consult: Initial assessment   Maternal Data Formula Feeding for Exclusion: Yes Reason for exclusion: Mother's choice to formula and breast feed on admission Infant to breast within first hour of birth: Yes  Feeding    LATCH Score/Interventions                      Lactation Tools Discussed/Used     Consult Status Consult Status: Follow-up Date: 07/22/13 Follow-up type: In-patient    Deanna Kaufman 07/21/2013, 11:56 AM

## 2013-07-21 NOTE — Progress Notes (Signed)
PPD 1 SVD of viable baby boy  S:  Reports feeling tired             Tolerating po/ No nausea or vomiting             Bleeding is "too much"             Pain 3 out of 10 at worst controlled with medications             Has not ambulated  / voiding QS  Newborn breast feeding  / Circumcision planned  O:               VS: BP 96/62  Pulse 71  Temp(Src) 97.4 F (36.3 C) (Oral)  Resp 18  Ht 4' 11.5" (1.511 m)  Wt 60.51 kg (133 lb 6.4 oz)  BMI 26.50 kg/m2  SpO2 98%  LMP 11/12/2012  Breastfeeding? Unknown   LABS:              Recent Labs  07/20/13 0910 07/21/13 0600  WBC 9.6 12.4*  HGB 11.3* 10.7*  PLT 242 215               Blood type: --/--/O POS (05/19 0910)  Rubella: Equivocal (11/10 0000)                               Physical Exam:             Alert and oriented X3  Lungs: Clear and unlabored  Heart: regular rate and rhythm / no mumurs  Abdomen: soft, non-tender, non-distended              Fundus: firm, non-tender, 1 FB below umbilicus  Perineum: moist, intact, edematous  Lochia: Rubra moderate to pad  Extremities:  No edema, no calf pain or tenderness    A: PPD #1  Doing well - stable status  P: Routine post partum orders  Instructed to use ice pack to perineum  Ambulate today  Rubella booster prior to discharge  Plan for discharge 05/21   [facility] Gro[facility] SNM Marlinda Mikeanya Shantell Belongia CNM, MSN, [facility] 07/21/2013, 9:04 AM

## 2013-07-21 NOTE — Lactation Note (Signed)
This note was copied from the chart of Deanna Kaufman. Lactation Consultation Note    Follow up consult with this mom of a now 37 6/7 weeks corrected gestaton baby. Mom was trying to latch baby wrapped in blankets, arms in, in cradle hold. I unwrapped baby, sin to sin, and reviewed cross cradle hold and latch with mom. Baby latched well, strong suckles and swallows. Mom knows to call for questions/concerns  Patient Name: Deanna Tia MaskerFatimah Clemenson ZOXWR'UToday's Date: 07/21/2013 Reason for consult: Follow-up assessment   Maternal Data Formula Feeding for Exclusion: Yes Reason for exclusion: Mother's choice to formula and breast feed on admission Infant to breast within first hour of birth: Yes Has patient been taught Hand Expression?: Yes Does the patient have breastfeeding experience prior to this delivery?: Yes  Feeding Feeding Type: Breast Fed Length of feed: 18 min (baby was still sucking when I left the room - at least 18 minutes)  LATCH Score/Interventions Latch: Repeated attempts needed to sustain latch, nipple held in mouth throughout feeding, stimulation needed to elicit sucking reflex. Intervention(s): Skin to skin;Teach feeding cues;Waking techniques Intervention(s): Adjust position;Assist with latch  Audible Swallowing: A few with stimulation Intervention(s): Skin to skin;Hand expression Intervention(s): Hand expression  Type of Nipple: Everted at rest and after stimulation  Comfort (Breast/Nipple): Soft / non-tender     Hold (Positioning): Assistance needed to correctly position infant at breast and maintain latch. Intervention(s): Breastfeeding basics reviewed;Support Pillows;Position options;Skin to skin  LATCH Score: 7  Lactation Tools Discussed/Used     Consult Status Consult Status: Follow-up Date: 07/22/13 Follow-up type: In-patient    Alfred LevinsChristine Anne Clarion Mooneyhan 07/21/2013, 4:08 PM

## 2013-07-22 ENCOUNTER — Ambulatory Visit: Payer: Self-pay

## 2013-07-22 MED ORDER — IBUPROFEN 600 MG PO TABS: 600.0000 mg | ORAL_TABLET | Freq: Four times a day (QID) | ORAL | Status: AC

## 2013-07-22 MED ORDER — POLYSACCHARIDE IRON COMPLEX 150 MG PO CAPS
150.0000 mg | ORAL_CAPSULE | Freq: Every day | ORAL | Status: DC
  Filled 2013-07-22 (×2): qty 1

## 2013-07-22 MED ORDER — POLYSACCHARIDE IRON COMPLEX 150 MG PO CAPS: 150.0000 mg | ORAL_CAPSULE | Freq: Every day | ORAL | Status: AC

## 2013-07-22 MED ORDER — OXYCODONE-ACETAMINOPHEN 5-325 MG PO TABS: 1.0000 | ORAL_TABLET | ORAL | Status: AC | PRN

## 2013-07-22 MED ORDER — PNEUMOCOCCAL VAC POLYVALENT 25 MCG/0.5ML IJ INJ
0.5000 mL | INJECTION | INTRAMUSCULAR | Status: AC
  Administered 2013-07-22: 0.5 mL via INTRAMUSCULAR
  Filled 2013-07-22 (×2): qty 0.5

## 2013-07-22 NOTE — Lactation Note (Signed)
This note was copied from the chart of Deanna Kaufman. Lactation Consultation Note     Follow up consult with this mom of a now 38 week corrected gestation baby, weighing just over 6 pounds. Baby is a patient today, but mom has been officially discharged,and staying to work on breast feeding. Mom was breast feeding the baby when i walked in the room, and had baby latched deeply, with strong suckle and visible swallows. Mom had been switching breast every 15 minutes. I explained to mom the benefit of hind milk, and how she should allow the baby to soften first latched breast , before switching to the other , and to always alternate beginning breast each feed. Mom inquired about support group, and I reviewed this information with her. She knows to call for questions/concerns.  Patient Name: Deanna Tia MaskerFatimah Gow ZOXWR'UToday's Date: 07/22/2013 Reason for consult: Follow-up assessment   Maternal Data    Feeding Feeding Type: Breast Fed Length of feed: 30 min  LATCH Score/Interventions Latch: Grasps breast easily, tongue down, lips flanged, rhythmical sucking.  Audible Swallowing: Spontaneous and intermittent  Type of Nipple: Everted at rest and after stimulation  Comfort (Breast/Nipple): Soft / non-tender     Hold (Positioning): No assistance needed to correctly position infant at breast. Intervention(s): Breastfeeding basics reviewed;Support Pillows;Position options;Skin to skin  LATCH Score: 10  Lactation Tools Discussed/Used     Consult Status Consult Status: PRN Follow-up type: Call as needed    Alfred LevinsChristine Anne Sindia Kowalczyk 07/22/2013, 3:25 PM

## 2013-07-22 NOTE — Progress Notes (Signed)
PPD #2- SVD  Subjective:   Reports feeling well, perineum sore Tolerating po/ No nausea or vomiting Bleeding is light Pain controlled with Motrin and Percocet Up ad lib / ambulatory / voiding without problems Newborn: breastfeeding  / Circumcision: done   Objective:   VS: VS:  Filed Vitals:   07/21/13 0509 07/21/13 0750 07/21/13 1739 07/22/13 0510  BP: 91/50 96/62 91/61  99/65  Pulse: 73 71 77 98  Temp: 98.1 F (36.7 C) 97.4 F (36.3 C) 98.8 F (37.1 C) 98.1 F (36.7 C)  TempSrc: Oral Oral Oral Oral  Resp: 17 18 18 18   Height:      Weight:      SpO2: 98%       LABS:  Recent Labs  07/20/13 0910 07/21/13 0600  WBC 9.6 12.4*  HGB 11.3* 10.7*  PLT 242 215   Blood type: --/--/O POS (05/19 0910) Rubella: Equivocal (11/10 0000)                I&O: Intake/Output     05/20 0701 - 05/21 0700 05/21 0701 - 05/22 0700   Urine (mL/kg/hr)     Blood     Total Output       Net              Physical Exam: Alert and oriented X3 Abdomen: soft, non-tender, non-distended  Fundus: firm, non-tender, U-1 Perineum: Well approximated, no significant erythema, edema, or drainage; healing well. Lochia: small Extremities: no edema, no calf pain or tenderness    Assessment: PPD # 2 G2P2002/ S/P:spontaneous vaginal, 2nd degree laceration Mild anemia Doing well - stable for discharge home   Plan: Rubella vaccine prior to discharge Discharge home RX's:  Ibuprofen 600mg  po Q 6 hrs prn pain #30 Refill x 0 Niferex 150mg  po QD #30  Refill x 1 Percocet 5/325 1 to 2 po Q 4 hrs prn pain #30 Refill x 0 Routine pp visit in 6wks, and glucose test [facility] given    Lawernce PittsMelanie N Fayrene Towner MSN, CNM 07/22/2013, 11:44 AM

## 2013-07-22 NOTE — Discharge Summary (Signed)
Obstetric Discharge Summary Reason for Admission: onset of labor Prenatal Procedures: ultrasound, A2GDM Intrapartum Procedures: spontaneous vaginal delivery Postpartum Procedures: Rubella Ig Complications-Operative and Postpartum: 2nd degree perineal laceration Hemoglobin  Date Value Ref Range Status  07/21/2013 10.7* 12.0 - 15.0 g/dL Final     HCT  Date Value Ref Range Status  07/21/2013 33.1* 36.0 - 46.0 % Final    Physical Exam:  General: alert and cooperative Lochia: appropriate Uterine Fundus: firm Incision: healing well, no significant drainage, no dehiscence, no significant erythema DVT Evaluation: No evidence of DVT seen on physical exam. Negative Homan's sign. No cords or calf tenderness. No significant calf/ankle edema.  Discharge Diagnoses: Term Pregnancy-delivered  Discharge Information: Date: 07/22/2013 Activity: pelvic rest Diet: routine Medications: PNV, Ibuprofen, Iron and Percocet Condition: stable Instructions: refer to practice specific booklet Discharge to: home   Newborn Data: Live born female on 07[birth year]20/13 Birth Weight: 6 lb 3.6 oz (2824 g) APGAR: 8, 9  Home with mother.  Deanna PittsMelanie N Faline Kaufman 07/22/2013, 1:14 PM

## 2013-07-23 ENCOUNTER — Ambulatory Visit: Payer: Self-pay

## 2013-07-23 NOTE — Lactation Note (Signed)
This note was copied from the chart of Deanna Kaufman. Lactation Consultation Note  Patient Name: Deanna Kaufman NBVAP'O Date: 07/23/2013 Reason for consult: Follow-up assessment;Late preterm infant;Difficult latch Baby 67 hours of life. Mom reports that baby has been latching well. Baby is sleepy and will not latch now. Enc mom to call out if baby wakes so LC can see latch. 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 Offered to rent mom a pump in the meantime, but mom wants to call insurance first. Enc mom to call insurance before discharging so that she can leave with a pump if she needs it. Mom aware of OP/BFSG and community resources. Enc mom to call [name] office if needs assistance. Reviewed with mom late preterm behavior and basic BF teaching. Enc mom to feed often. Reviewed engorgement prevention/treatment. Gave mom a hand pump with instructions. Mom states her first baby was early and little as well. Referred mom to number of diapers to expect in the Baby and Me booklet and chart for EBM storage.  Maternal Data    Feeding Feeding Type: Breast Fed Length of feed: 40 min  LATCH Score/Interventions Latch: Repeated attempts needed to sustain latch, nipple held in mouth throughout feeding, stimulation needed to elicit sucking reflex. Intervention(s): Adjust position  Audible Swallowing: Spontaneous and intermittent  Type of Nipple: Everted at rest and after stimulation  Comfort (Breast/Nipple): Soft / non-tender     Hold (Positioning): Assistance needed to correctly position infant at breast and maintain latch. Intervention(s): Support Pillows;Position options  LATCH Score: 8  Lactation Tools Discussed/Used     Consult Status Consult Status: PRN Follow-up type: In-patient    [name] 07/23/2013, 12:21 PM

## 2014-01-03 ENCOUNTER — Encounter (HOSPITAL_COMMUNITY): Payer: Self-pay | Admitting: *Deleted

## 2015-01-21 IMAGING — US OBSTETRIC <14 WK ULTRASOUND
1 series · 14 of 25 positions shown · non-contrast
Comparison: None.

CLINICAL DATA: Left-sided pelvic pain. Vaginal bleeding. Positive
pregnancy test.

EXAM:
OBSTETRIC <14 WK ULTRASOUND
TECHNIQUE: Transabdominal ultrasound was performed for evaluation of the
gestation as well as the maternal uterus and adnexal regions.

[Series 1: us ob comp less 14 wks · 14 of 25 slices shown]
[im 1/25]
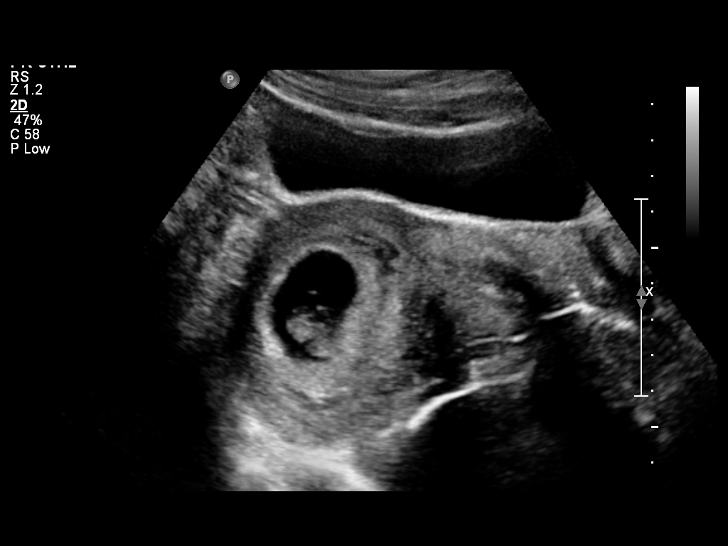
[im 3/25]
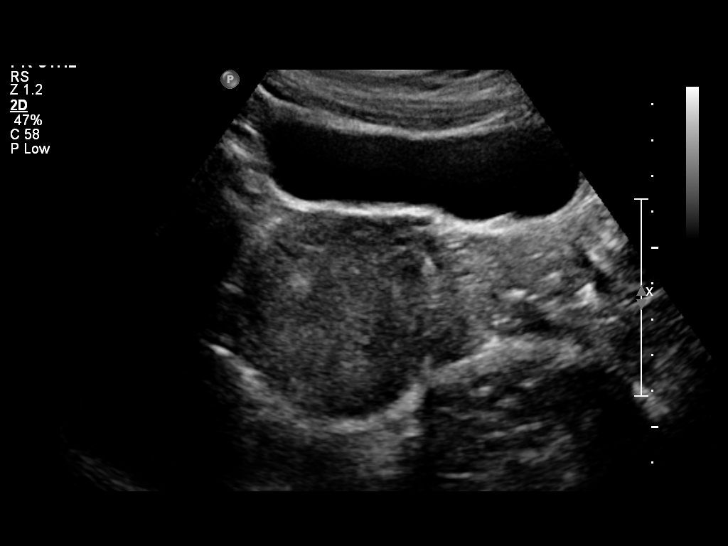
[im 5/25]
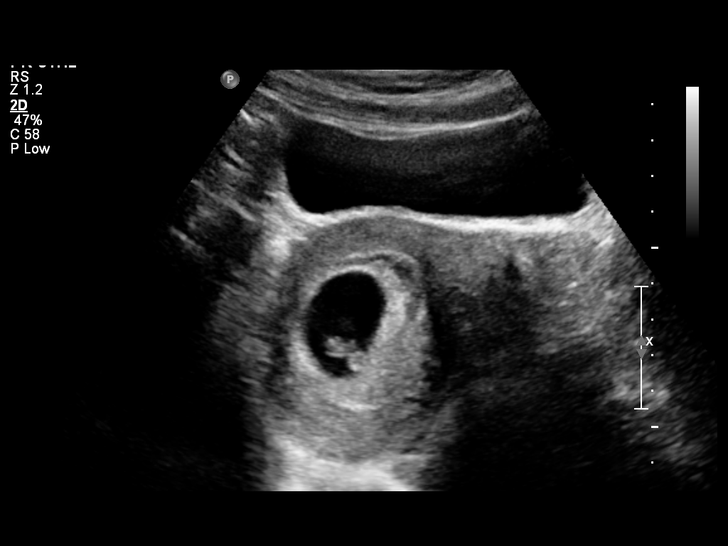
[im 7/25]
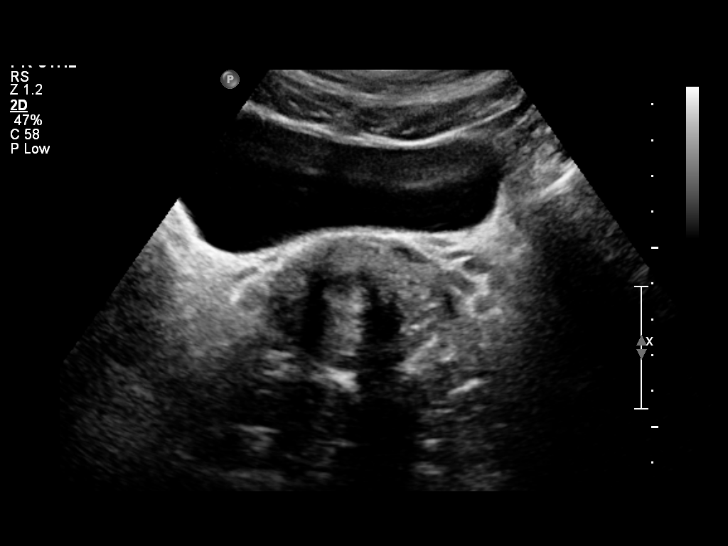
[im 9/25]
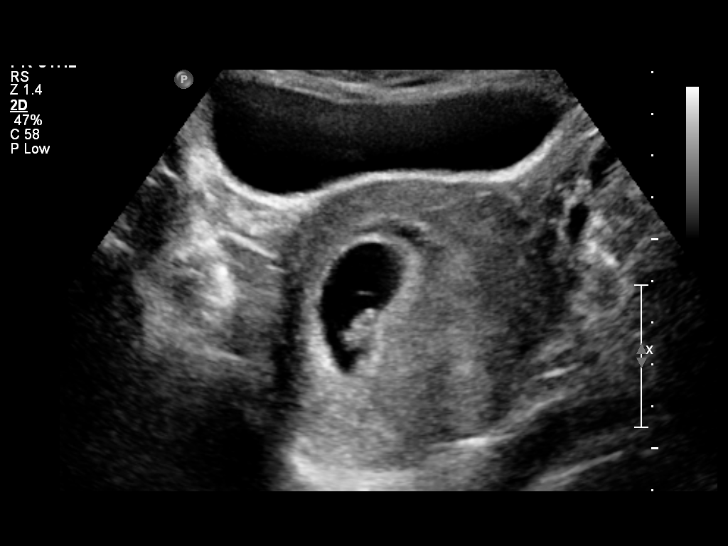
[im 10/25]
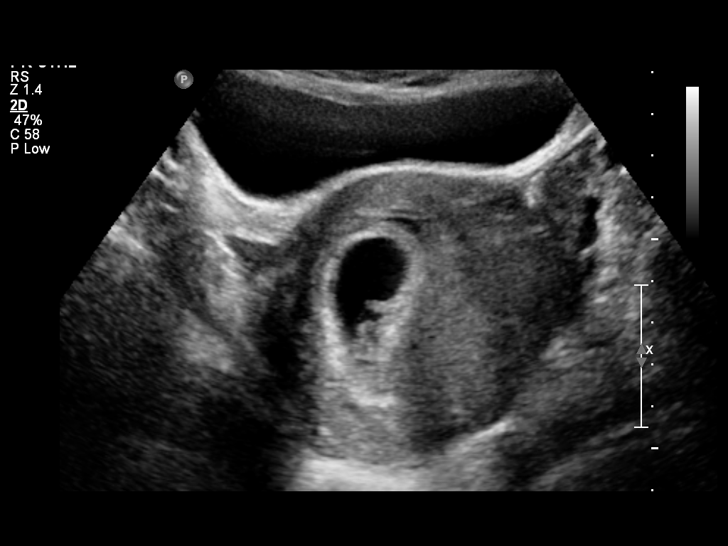
[im 12/25]
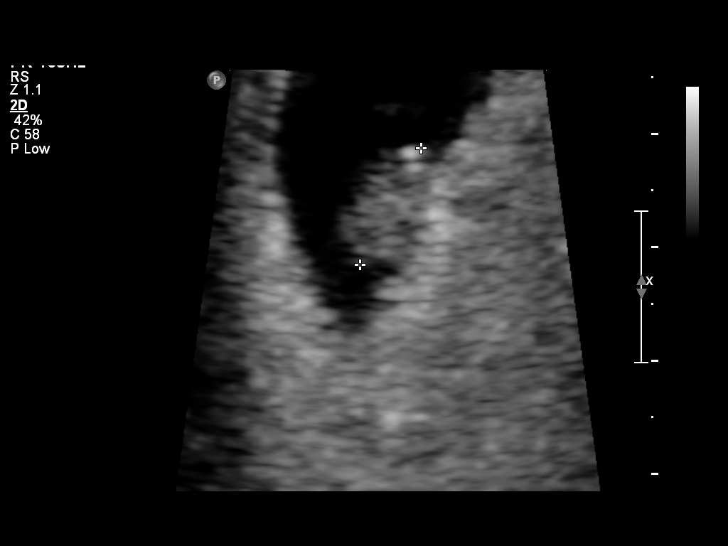
[im 14/25]
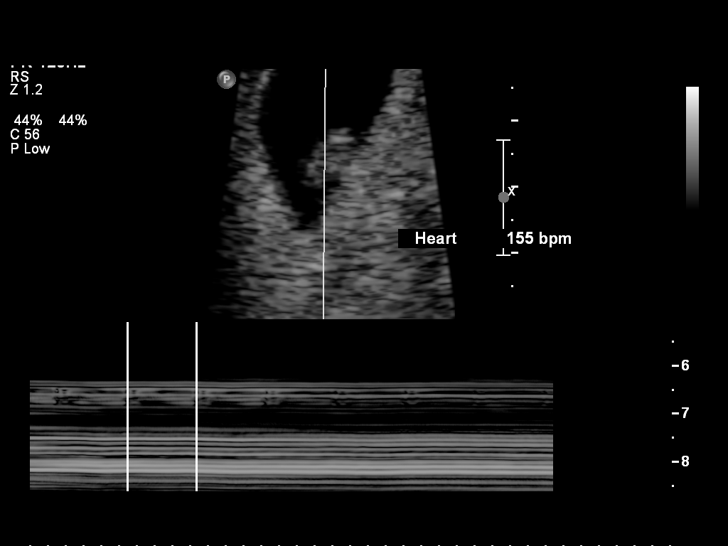
[im 16/25]
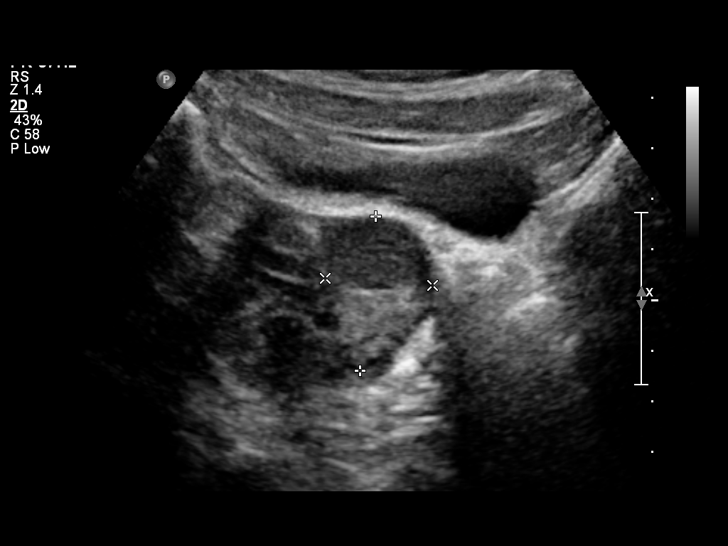
[im 17/25]
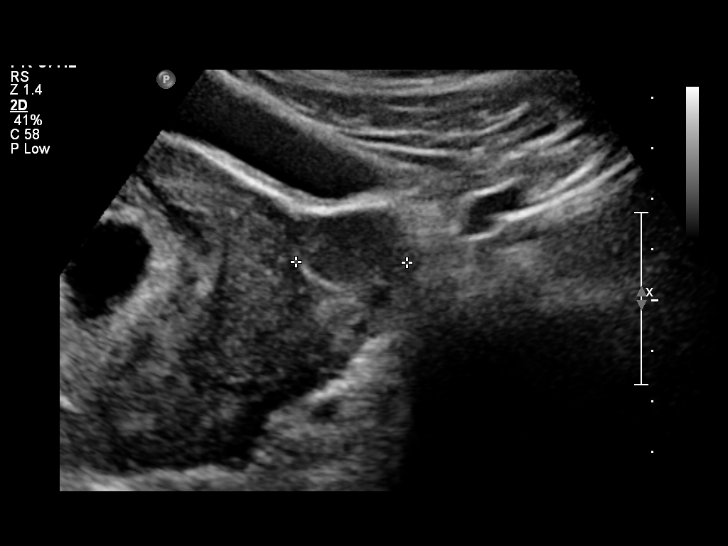
[im 19/25]
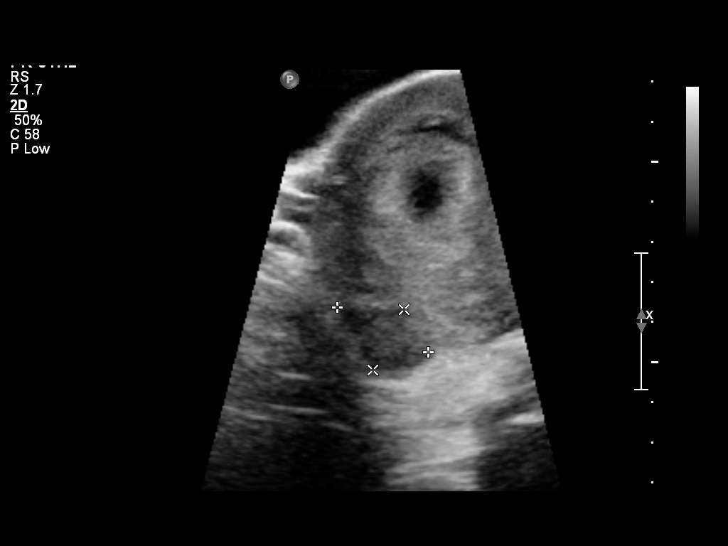
[im 21/25]
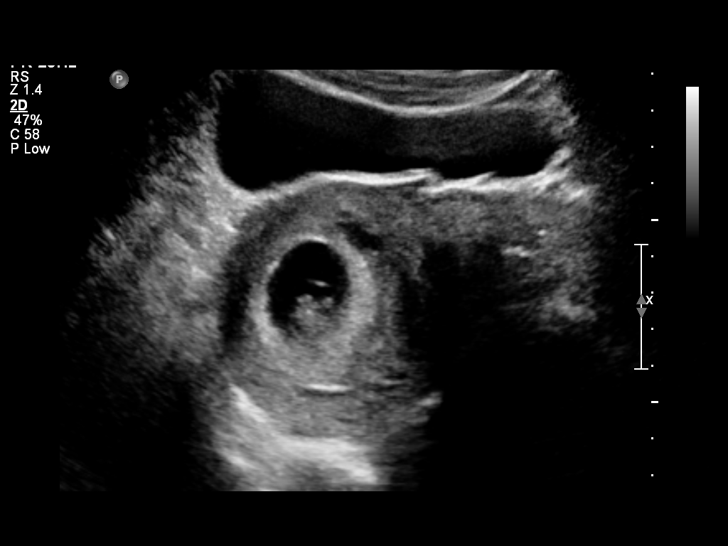
[im 23/25]
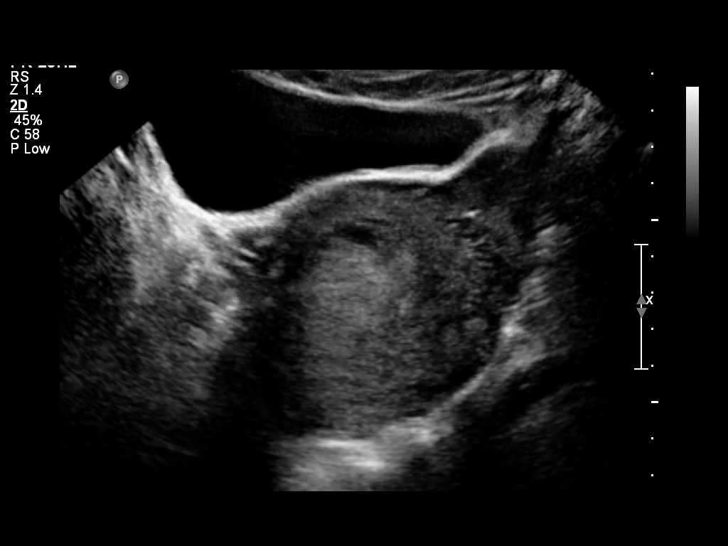
[im 25/25]
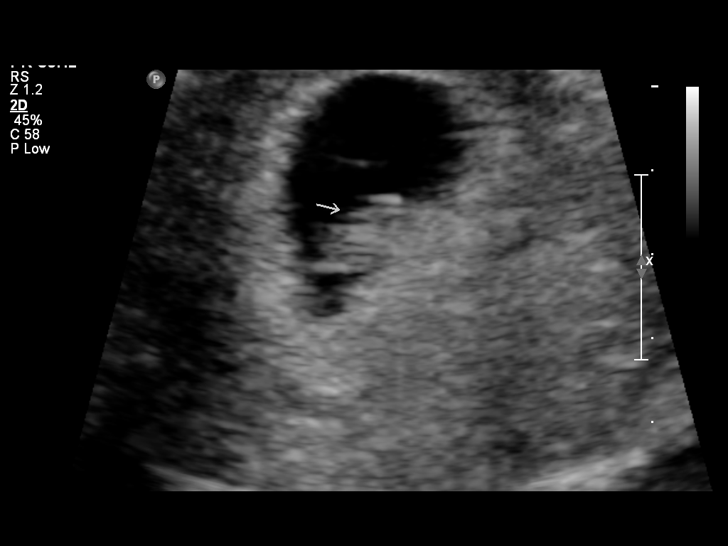

[14 of 25 positions shown; findings below may reference images not displayed]

FINDINGS: Intrauterine gestational sac: Visualized/normal in shape.

Yolk sac:  Present

Embryo:  Present

Cardiac Activity: Present

Heart Rate: 155 bpm

MSD:   mm    w     d

CRL:   11.9  mm   7 w 3 d                  US EDC: 08/05/2013.

Maternal uterus/adnexae: Small subchorionic hemorrhage.

Normal right ovary.

Normal left ovary.  Corpus luteum cyst noted.

No free pelvic fluid collections.
IMPRESSION: Single living intrauterine embryo estimated at 7 weeks and 3 days
gestation.

Small subchorionic hemorrhage.

Normal ovaries.

## 2015-02-26 IMAGING — US US OB COMP LESS 14 WK
1 series · 14 of 28 positions shown · non-contrast
Comparison: 12/20/12

CLINICAL DATA: Vaginal bleeding. 12 week 4 day assigned gestational
age by prior ultrasound.

EXAM:
OBSTETRIC <14 WK ULTRASOUND
TECHNIQUE: Transabdominal ultrasound was performed for evaluation of the
gestation as well as the maternal uterus and adnexal regions.

[Series 1: us ob comp less 14 wks · 40 acquisitions, 14 frames shown]
[im 2/40]
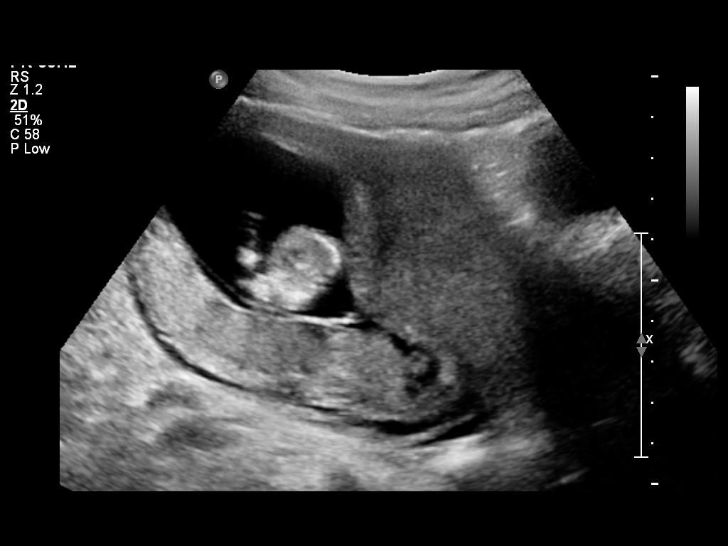
[im 5/40]
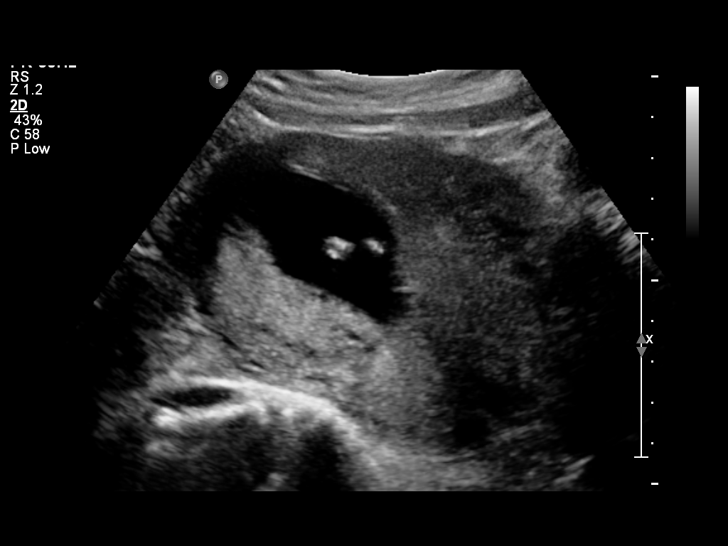
[im 8/40]
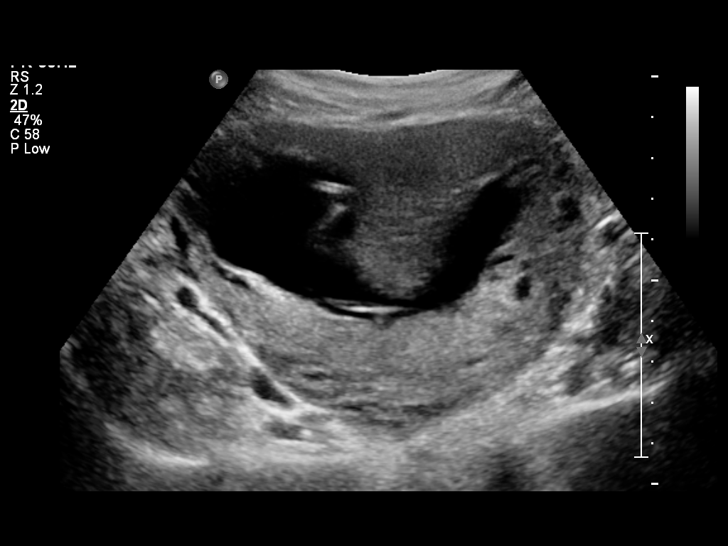
[im 11/40]
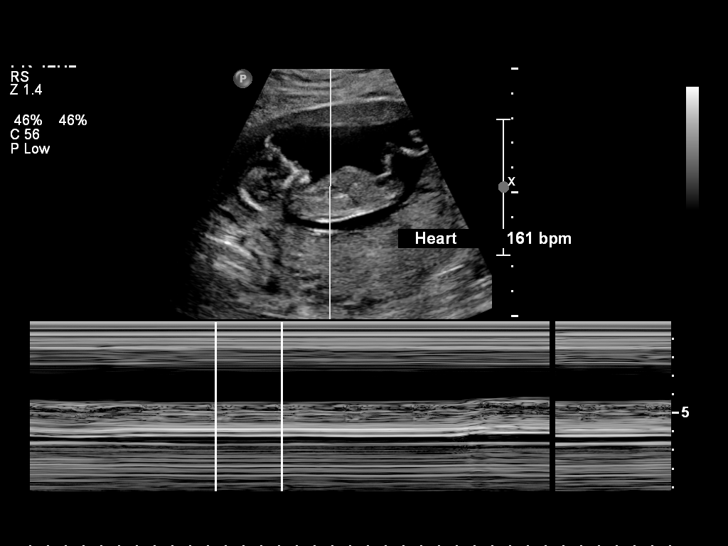
[im 14/40]
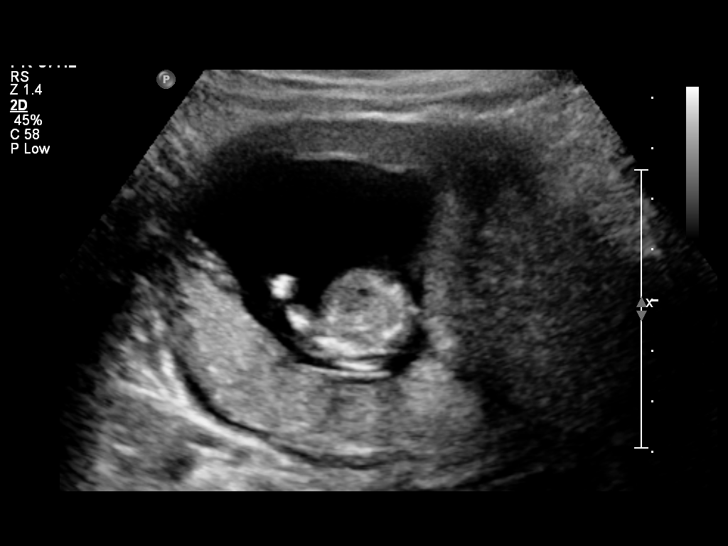
[im 16/40]
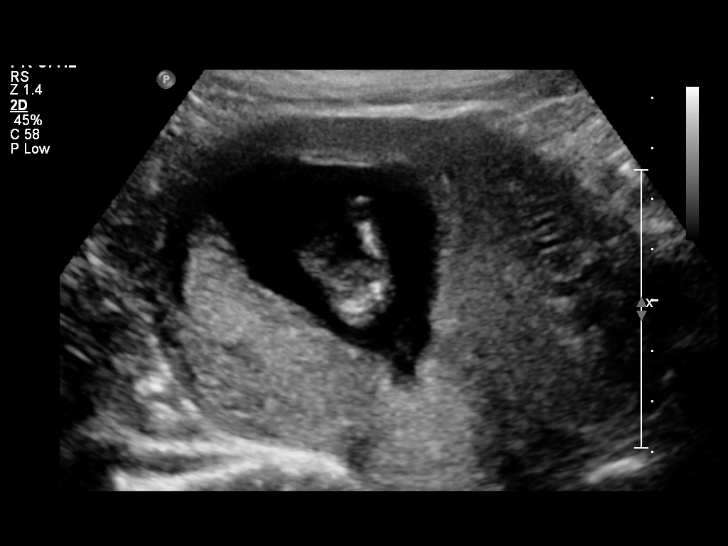
[im 19/40]
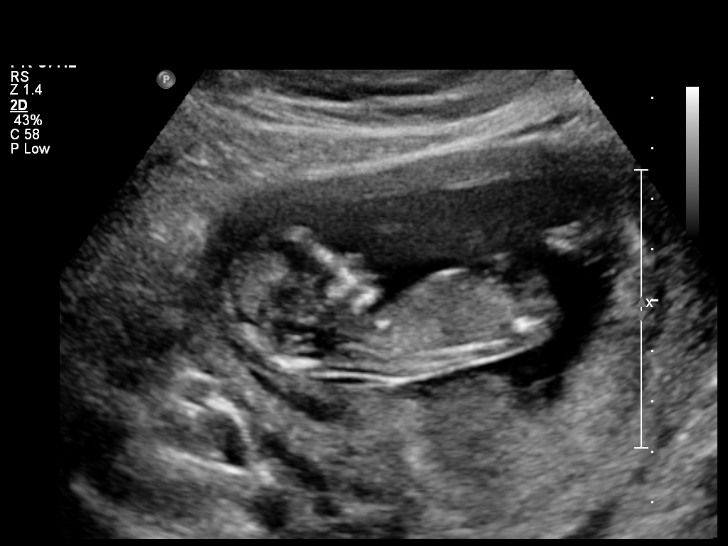
[im 22/40]
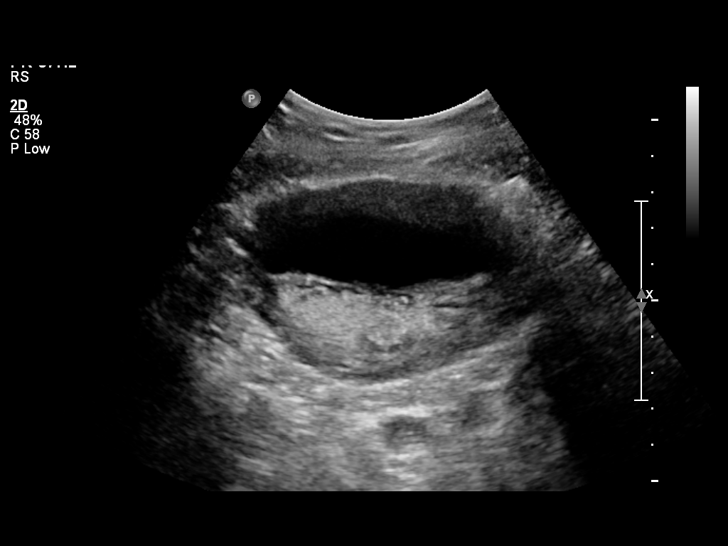
[im 25/40]
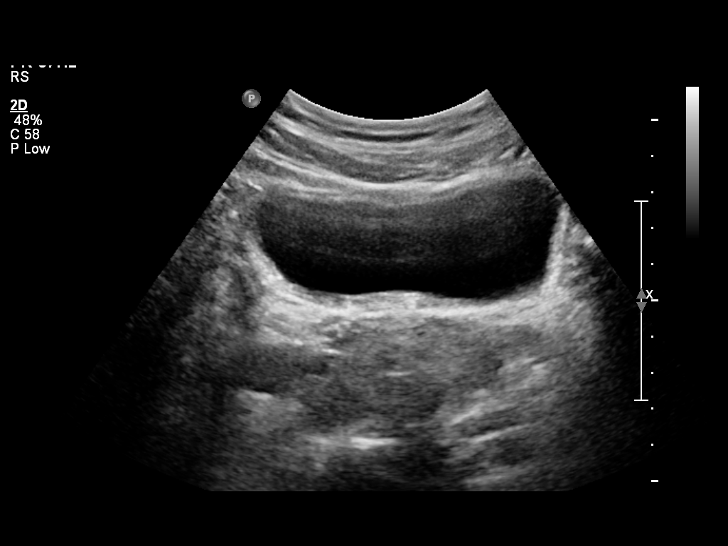
[im 28/40]
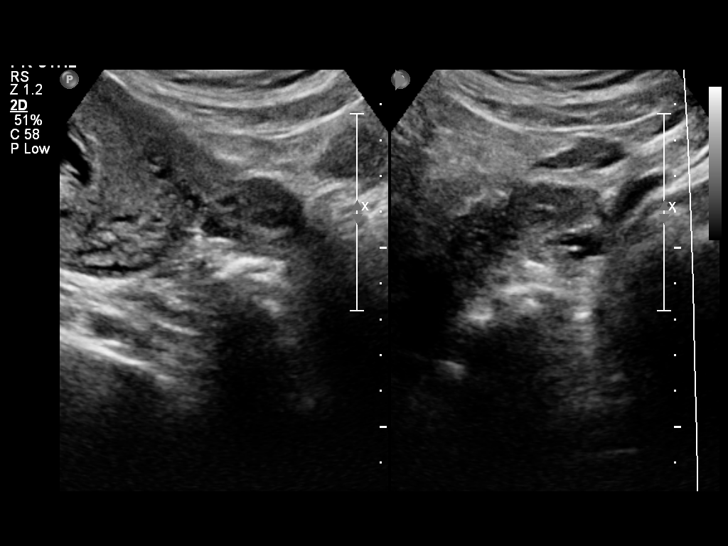
[im 31/40]
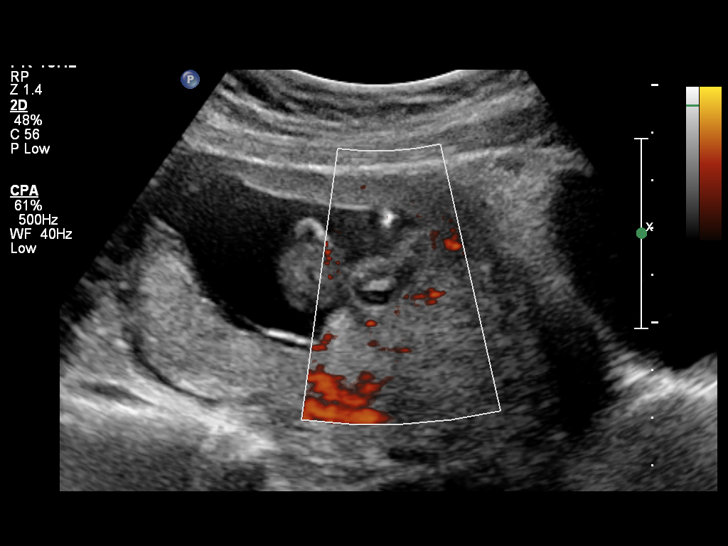
[im 34/40]
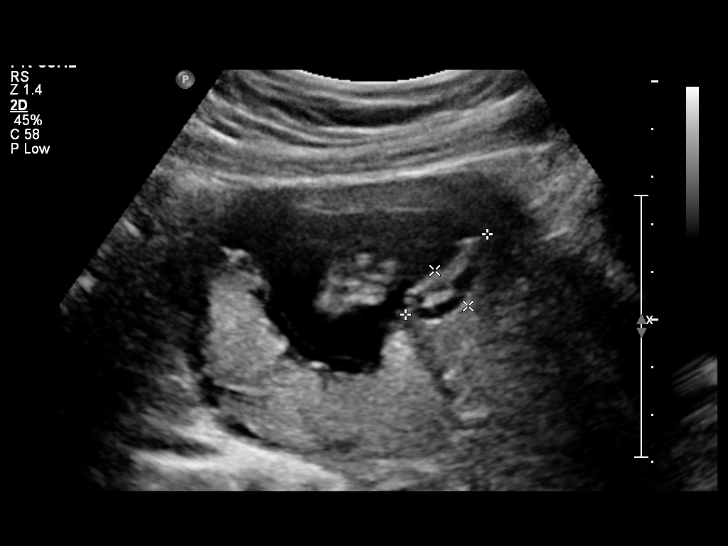
[im 37/40]
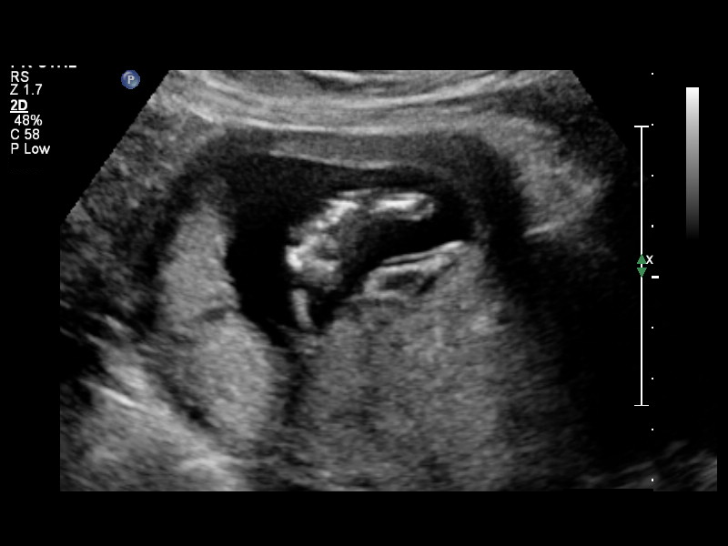
[im 40/40]
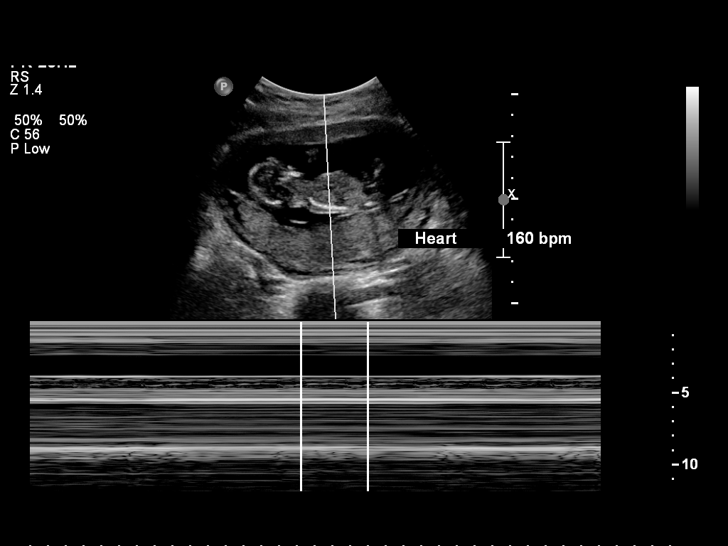

[14 of 28 positions shown; findings below may reference images not displayed]

FINDINGS: Intrauterine gestational sac: Visualized/normal in shape.

Yolk sac:  Not visualized

Embryo:  Visualized

Cardiac Activity: Visualized

Heart Rate: 160 bpm

CRL:   65  mm   12 w 6 d                  US EDC: 08/03/2013

Maternal uterus/adnexae: Small subchorionic hemorrhage noted in the
lower uterine segment. Both ovaries are normal in appearance. No
adnexal mass or free fluid identified.
IMPRESSION: Single living IUP with assigned gestational age of 12 weeks 4 days.
Appropriate interval fetal growth.

Small subchorionic hemorrhage noted.

## 2019-05-20 ENCOUNTER — Ambulatory Visit: Payer: Managed Care, Other (non HMO) | Attending: Internal Medicine
